# Patient Record
Sex: Female | Born: 1975 | Hispanic: No | Smoking: Never smoker
Health system: Southern US, Community
[De-identification: ages and names within clinical notes are randomized; demographics above are authoritative.]

## PROBLEM LIST (undated history)

## (undated) DIAGNOSIS — B1910 Unspecified viral hepatitis B without hepatic coma: Secondary | ICD-10-CM

## (undated) HISTORY — PX: ABDOMINAL HYSTERECTOMY: SHX81

---

## 2018-03-03 ENCOUNTER — Other Ambulatory Visit: Payer: Self-pay | Admitting: Obstetrics and Gynecology

## 2018-03-03 DIAGNOSIS — R928 Other abnormal and inconclusive findings on diagnostic imaging of breast: Secondary | ICD-10-CM

## 2018-03-08 ENCOUNTER — Ambulatory Visit
Admission: RE | Admit: 2018-03-08 | Discharge: 2018-03-08 | Disposition: A | Discharge status: Home / Self Care | Payer: No Typology Code available for payment source | Source: Ambulatory Visit | Attending: Obstetrics and Gynecology | Admitting: Obstetrics and Gynecology

## 2018-03-08 ENCOUNTER — Other Ambulatory Visit: Payer: Self-pay | Admitting: Obstetrics and Gynecology

## 2018-03-08 DIAGNOSIS — R928 Other abnormal and inconclusive findings on diagnostic imaging of breast: Secondary | ICD-10-CM

## 2018-03-08 DIAGNOSIS — N6489 Other specified disorders of breast: Secondary | ICD-10-CM

## 2018-03-14 ENCOUNTER — Emergency Department (HOSPITAL_COMMUNITY)
Admission: EM | Admit: 2018-03-14 | Discharge: 2018-03-14 | Disposition: A | Discharge status: Home / Self Care | Payer: No Typology Code available for payment source | Attending: Emergency Medicine | Admitting: Emergency Medicine

## 2018-03-14 ENCOUNTER — Encounter (HOSPITAL_COMMUNITY): Payer: Self-pay

## 2018-03-14 ENCOUNTER — Other Ambulatory Visit: Payer: Self-pay

## 2018-03-14 DIAGNOSIS — R6883 Chills (without fever): Secondary | ICD-10-CM | POA: Diagnosis present

## 2018-03-14 DIAGNOSIS — N390 Urinary tract infection, site not specified: Secondary | ICD-10-CM

## 2018-03-14 HISTORY — DX: Unspecified viral hepatitis B without hepatic coma: B19.10

## 2018-03-14 LAB — URINALYSIS, ROUTINE W REFLEX MICROSCOPIC
Bilirubin Urine: NEGATIVE
Glucose, UA: NEGATIVE mg/dL
Hgb urine dipstick: NEGATIVE
Ketones, ur: 5 mg/dL — AB
Nitrite: NEGATIVE
Protein, ur: NEGATIVE mg/dL
SPECIFIC GRAVITY, URINE: 1.009 (ref 1.005–1.030)
pH: 6 (ref 5.0–8.0)

## 2018-03-14 MED ORDER — CIPROFLOXACIN HCL 500 MG PO TABS: 500.0000 mg | ORAL_TABLET | Freq: Two times a day (BID) | ORAL | 0 refills | Status: AC

## 2018-03-14 MED ORDER — CIPROFLOXACIN HCL 250 MG PO TABS
500.0000 mg | ORAL_TABLET | Freq: Once | ORAL | Status: DC
  Filled 2018-03-14: qty 2

## 2018-03-14 NOTE — ED Notes (Signed)
RN attempted to educate Pt on UTI and treatment for infection. Pt acknowledged understanding and willingly refused to take her Antibiotics. Pt stated she does not want to get her antibiotics or use her prescription. Pt states she would rather try treating it on her own. MD Notified.

## 2018-03-14 NOTE — ED Provider Notes (Signed)
[facility] EMERGENCY DEPARTMENT Provider Note   CSN: [id number] Arrival date & time: 03/14/18  0006  Time seen 12:40 AM   History   Chief Complaint Chief Complaint  Patient presents with  . Chills    HPI Nicole Mcpherson is a 42 y.o. female.  HPI patient reports about 2 days ago she started getting chills that lasted 5 to 10 minutes.  She states she has discomfort in her arms and her legs and her muscles feel tight.  She states when she checks her temperature however it is only 98.  She denies nausea, vomiting, diarrhea, dysuria, hematuria, or flank pain.  She does describe some lower back pain and has had frequency about 3-5 times a day for the past 3 days and nocturia about twice a night for the last 3 days.  She also has some right lower quadrant pain which actually she has had for many months.  She states her mouth feels dry but she is drinking normally.  She states sometimes she feels like she has something stuck in her throat but it goes away if she drinks something in a few minutes.  She states she was having heavy periods and had a hysterectomy last year.  She had to have transfusions done and got 3 units of blood.  She states she was in [place] in [place] and was tested for STDs including hepatitis screen due to her complaints of right lower quadrant pain and her hepatitis screen was negative.  She states she was seen again last week by her GYN Dr. Skeet Latch had a positive hepatitis B titer.  She also had trichomonas that she states was treated with Keflex.  She has an appointment to be rechecked on the 28th to get a ultrasound of her ovaries.  When I talked to the patient she is actually been having this pain for many months.  She states the reason she came to the ED is because of the chills.  Patient also states she has had pain in her right ear for a week and it actually is the whole right side of her face that is been there constantly.  Patient states she was seen in urgent  care 3 weeks ago for the right sided facial pain although she told me it only started a week ago and had a negative strep at that time.  PCP Ranae Pila, MD   Past Medical History:  Diagnosis Date  . Hepatitis B     There are no active problems to display for this patient.   Past Surgical History:  Procedure Laterality Date  . ABDOMINAL HYSTERECTOMY       OB History   None      Home Medications    Prior to Admission medications   Medication Sig Start Date End Date Taking? Authorizing Provider  ciprofloxacin (CIPRO) 500 MG tablet Take 1 tablet (500 mg total) by mouth 2 (two) times daily. 03/14/18   Devoria Albe, MD    Family History Family History  Problem Relation Age of Onset  . Breast cancer Sister 56    Social History Social History   Tobacco Use  . Smoking status: Never Smoker  . Smokeless tobacco: Never Used  Substance Use Topics  . Alcohol use: Never    Frequency: Never  . Drug use: Never  employed, lives in [place] Works in quality control for land[employer] States sexually active with same partner since left her husband five years  ago   Allergies   Patient has no known allergies.   Review of Systems Review of Systems  All other systems reviewed and are negative.    Physical Exam Updated Vital Signs BP (!) 111/57   Pulse (!) 49   Temp 97.9 F (36.6 C) (Oral)   Resp 16   Ht 5\' 2"  (1.575 m)   Wt 47.6 kg   SpO2 100%   BMI 19.20 kg/m   Vital signs normal except for bradycardia   Physical Exam  Constitutional: She is oriented to person, place, and time. She appears well-developed and well-nourished.  Non-toxic appearance. She does not appear ill. No distress.  HENT:  Head: Normocephalic and atraumatic.  Right Ear: Tympanic membrane, external ear and ear canal normal. No drainage, swelling or tenderness. No foreign bodies. No middle ear effusion.  Left Ear: External ear normal.  Nose: Nose normal. No mucosal  edema or rhinorrhea.  Mouth/Throat: Oropharynx is clear and moist and mucous membranes are normal. No dental abscesses or uvula swelling.  Eyes: Pupils are equal, round, and reactive to light. Conjunctivae and EOM are normal.  Neck: Normal range of motion and full passive range of motion without pain. Neck supple.  Cardiovascular: Normal rate, regular rhythm and normal heart sounds. Exam reveals no gallop and no friction rub.  No murmur heard. Pulmonary/Chest: Effort normal and breath sounds normal. No respiratory distress. She has no wheezes. She has no rhonchi. She has no rales. She exhibits no tenderness and no crepitus.  Abdominal: Soft. Normal appearance and bowel sounds are normal. She exhibits no distension. There is tenderness. There is no rebound and no guarding.    Musculoskeletal: Normal range of motion. She exhibits no edema or tenderness.  Moves all extremities well.   Neurological: She is alert and oriented to person, place, and time. She has normal strength. No cranial nerve deficit.  Skin: Skin is warm, dry and intact. No rash noted. No erythema. No pallor.  Psychiatric: She has a normal mood and affect. Her speech is normal and behavior is normal. Her mood appears not anxious.  Nursing note and vitals reviewed.    ED Treatments / Results  Labs (all labs ordered are listed, but only abnormal results are displayed) Results for orders placed or performed during the hospital encounter of 03/14/18  Urinalysis, Routine w reflex microscopic  Result Value Ref Range   Color, Urine YELLOW YELLOW   APPearance CLEAR CLEAR   Specific Gravity, Urine 1.009 1.005 - 1.030   pH 6.0 5.0 - 8.0   Glucose, UA NEGATIVE NEGATIVE mg/dL   Hgb urine dipstick NEGATIVE NEGATIVE   Bilirubin Urine NEGATIVE NEGATIVE   Ketones, ur 5 (A) NEGATIVE mg/dL   Protein, ur NEGATIVE NEGATIVE mg/dL   Nitrite NEGATIVE NEGATIVE   Leukocytes, UA MODERATE (A) NEGATIVE   RBC / HPF 0-5 0 - 5 RBC/hpf   WBC, UA  21-50 0 - 5 WBC/hpf   Bacteria, UA RARE (A) NONE SEEN   Squamous Epithelial / LPF 0-5 0 - 5   Laboratory interpretation all normal except possible UTI    EKG None  Radiology No results found.  Procedures Procedures (including critical care time)  Medications Ordered in ED Medications - No data to display   Initial Impression / Assessment and Plan / ED Course  I have reviewed the triage vital signs and the nursing notes.  Pertinent labs & imaging results that were available during my care of the patient were reviewed by me  and considered in my medical decision making (see chart for details).   Please note we have been having 90+ degree weather all summer and today the high was 68 and rainy all day.   Patient has minimal records in our epic system.  She has had some prior mammograms.  There are no records of her visit with her GYN this week.  Patient remained afebrile in the ED.  Patient states she was treated with Keflex recently, she was placed on Cipro for her UTI.  Urine culture was sent.  Patient read should return to the emergency department she gets uncontrolled vomiting.  She was advised to take Motrin for body aches or fever.  She was not recommended to use acetaminophen due to her recent diagnosis of hepatitis B.  Final Clinical Impressions(s) / ED Diagnoses   Final diagnoses:  Chills  Urinary tract infection without hematuria, site unspecified    ED Discharge Orders         Ordered    ciprofloxacin (CIPRO) 500 MG tablet  2 times daily     03/14/18 0144        OTC ibuprofen    Plan discharge  Devoria AlbeIva Porscha Axley, MD, [facility]oFACEP    Cung Masterson, MD 03/14/18 [id number]

## 2018-03-14 NOTE — ED Triage Notes (Addendum)
Pt reports chills, body aches, feeling cold, and right ear numbness x 2 days. Pt denies fever. Pt also reports lower abd pain on the right side. Pt was diagnosed with UTI and prescribed Keflex about two weeks ago. Pt was seen in [place] at an emergency dept. Pt says this feels similar.

## 2018-03-14 NOTE — Discharge Instructions (Signed)
Take the antibiotic until gone. You can take ibuprofen 400 mg every 6 hrs as needed for pain or fevers. Keep your appointment with Dr Elon SpannerLeger on the 28th. Return to the ED if you get uncontrolled vomiting.

## 2018-03-15 LAB — URINE CULTURE: Special Requests: NORMAL

## 2018-09-09 ENCOUNTER — Other Ambulatory Visit: Payer: Self-pay | Admitting: Obstetrics and Gynecology

## 2018-09-09 DIAGNOSIS — N6489 Other specified disorders of breast: Secondary | ICD-10-CM

## 2018-09-13 ENCOUNTER — Other Ambulatory Visit: Payer: No Typology Code available for payment source

## 2019-01-03 IMAGING — US ULTRASOUND LEFT BREAST LIMITED
1 series · 2 of 2 positions shown · non-contrast
Comparison: Previous exam(s).

CLINICAL DATA: 42-year-old female recalled from baseline screening
mammogram dated 03/02/2018 for bilateral asymmetries.

EXAM:
DIGITAL DIAGNOSTIC BILATERAL MAMMOGRAM WITH CAD AND TOMO
ULTRASOUND BILATERAL BREAST

[Series 1: ultrasound left breast limited · 0.06mm/px · 2 of 2 slices shown]
[im 1/2]
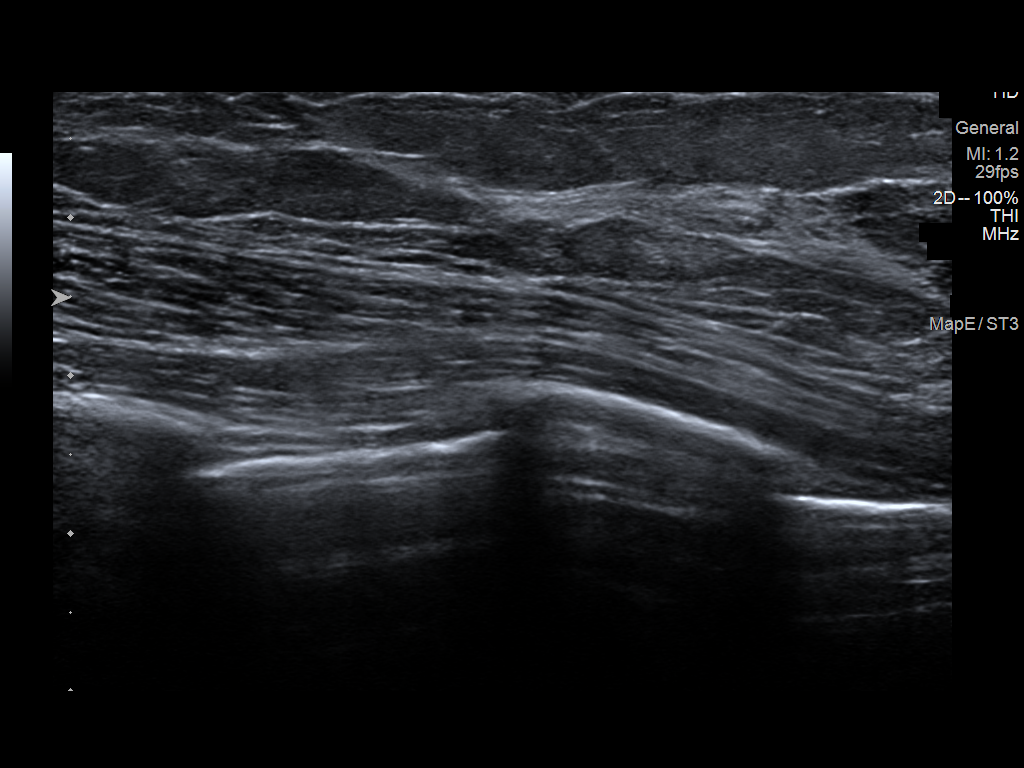
[im 2/2]
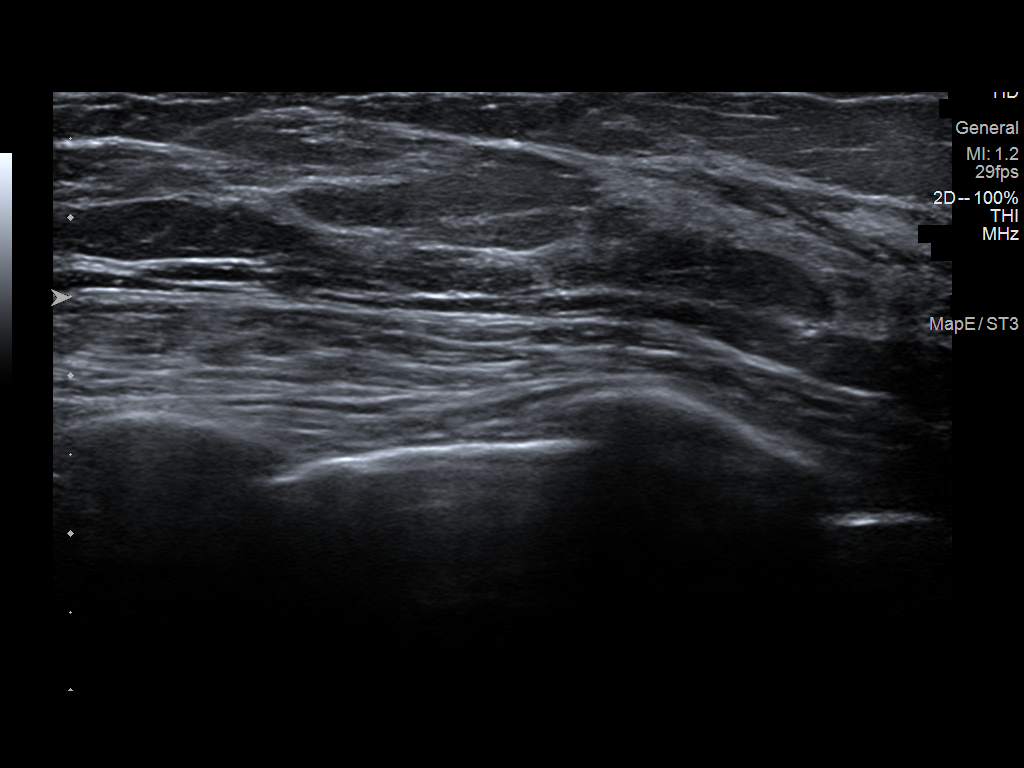

[2 of 2 positions shown; findings below may reference images not displayed]

ACR Breast Density Category d: The breast tissue is extremely dense,
which lowers the sensitivity of mammography.
FINDINGS: Previously described asymmetries persists in the superior central
right breast at posterior depth and far posterosuperior left breast
on the MLO projection only. Both areas demonstrate interspersed fat
suggesting a benign etiology. Further evaluation with ultrasound was
performed.

Mammographic images were processed with CAD.

Targeted ultrasound is performed, showing areas of dense
fibroglandular tissue in the right breast at the 1 o'clock position
8 cm from the nipple and far upper outer left breast. No focal or
suspicious sonographic findings are identified.
IMPRESSION: Probably benign asymmetries bilaterally without suspicious
ultrasound correlate. Findings may represent focal islands of
fibroglandular tissue is seen on the patient's baseline study.
Precautionary short-term follow-up is recommended.

RECOMMENDATION:
Bilateral diagnostic mammogram and possible ultrasound in 6 months.

I have discussed the findings and recommendations with the patient.
Results were also provided in writing at the conclusion of the
visit. If applicable, a reminder letter will be sent to the patient
regarding the next appointment.

BI-RADS CATEGORY  3: Probably benign.

## 2019-01-03 IMAGING — US ULTRASOUND RIGHT BREAST LIMITED
1 series · 2 of 2 positions shown · non-contrast
Comparison: Previous exam(s).

CLINICAL DATA: 42-year-old female recalled from baseline screening
mammogram dated 03/02/2018 for bilateral asymmetries.

EXAM:
DIGITAL DIAGNOSTIC BILATERAL MAMMOGRAM WITH CAD AND TOMO
ULTRASOUND BILATERAL BREAST

[Series 1: ultrasound right breast limited · 0.06mm/px · 2 of 2 slices shown]
[im 1/2]
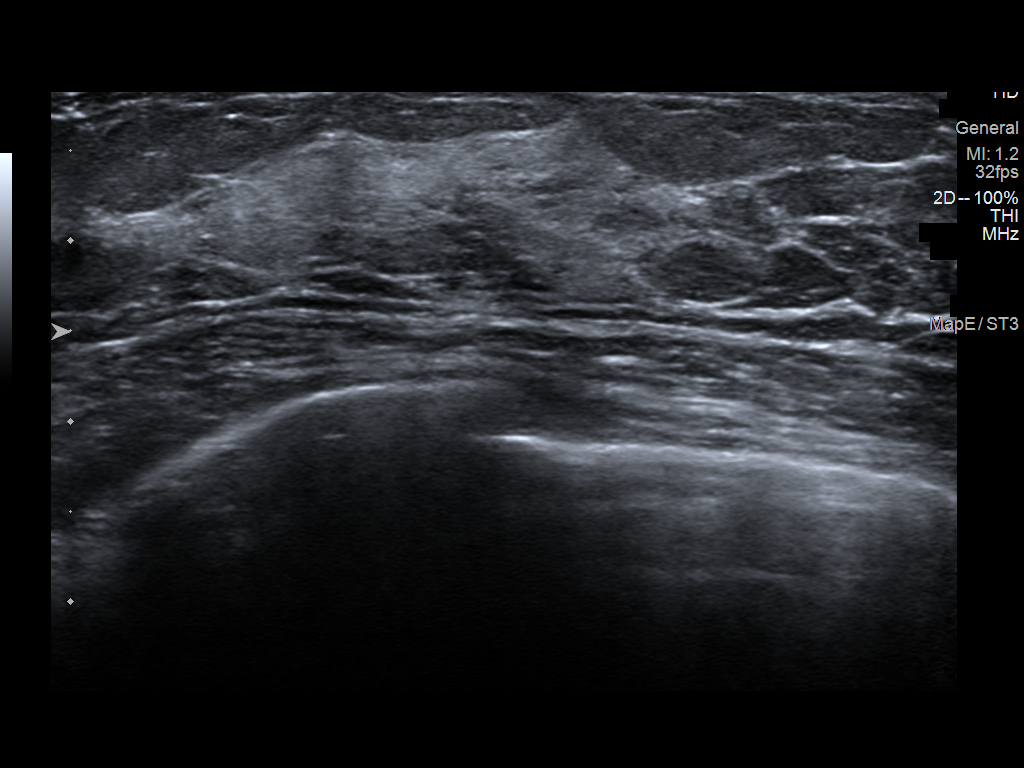
[im 2/2]
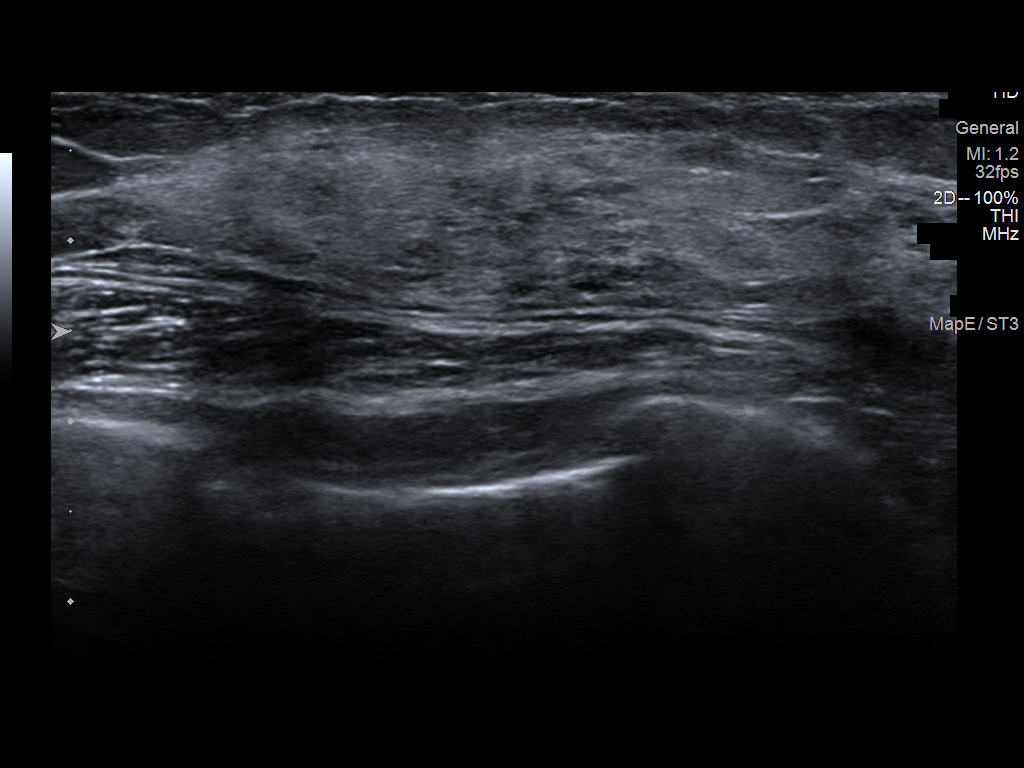

[2 of 2 positions shown; findings below may reference images not displayed]

ACR Breast Density Category d: The breast tissue is extremely dense,
which lowers the sensitivity of mammography.
FINDINGS: Previously described asymmetries persists in the superior central
right breast at posterior depth and far posterosuperior left breast
on the MLO projection only. Both areas demonstrate interspersed fat
suggesting a benign etiology. Further evaluation with ultrasound was
performed.

Mammographic images were processed with CAD.

Targeted ultrasound is performed, showing areas of dense
fibroglandular tissue in the right breast at the 1 o'clock position
8 cm from the nipple and far upper outer left breast. No focal or
suspicious sonographic findings are identified.
IMPRESSION: Probably benign asymmetries bilaterally without suspicious
ultrasound correlate. Findings may represent focal islands of
fibroglandular tissue is seen on the patient's baseline study.
Precautionary short-term follow-up is recommended.

RECOMMENDATION:
Bilateral diagnostic mammogram and possible ultrasound in 6 months.

I have discussed the findings and recommendations with the patient.
Results were also provided in writing at the conclusion of the
visit. If applicable, a reminder letter will be sent to the patient
regarding the next appointment.

BI-RADS CATEGORY  3: Probably benign.

## 2019-01-03 IMAGING — MG DIGITAL DIAGNOSTIC BILATERAL MAMMOGRAM WITH TOMO AND CAD
6 of 10 series · 6 of 30 positions shown · non-contrast
Comparison: Previous exam(s).

CLINICAL DATA: 42-year-old female recalled from baseline screening
mammogram dated 03/02/2018 for bilateral asymmetries.

EXAM:
DIGITAL DIAGNOSTIC BILATERAL MAMMOGRAM WITH CAD AND TOMO
ULTRASOUND BILATERAL BREAST

[R MLO synth-2D]
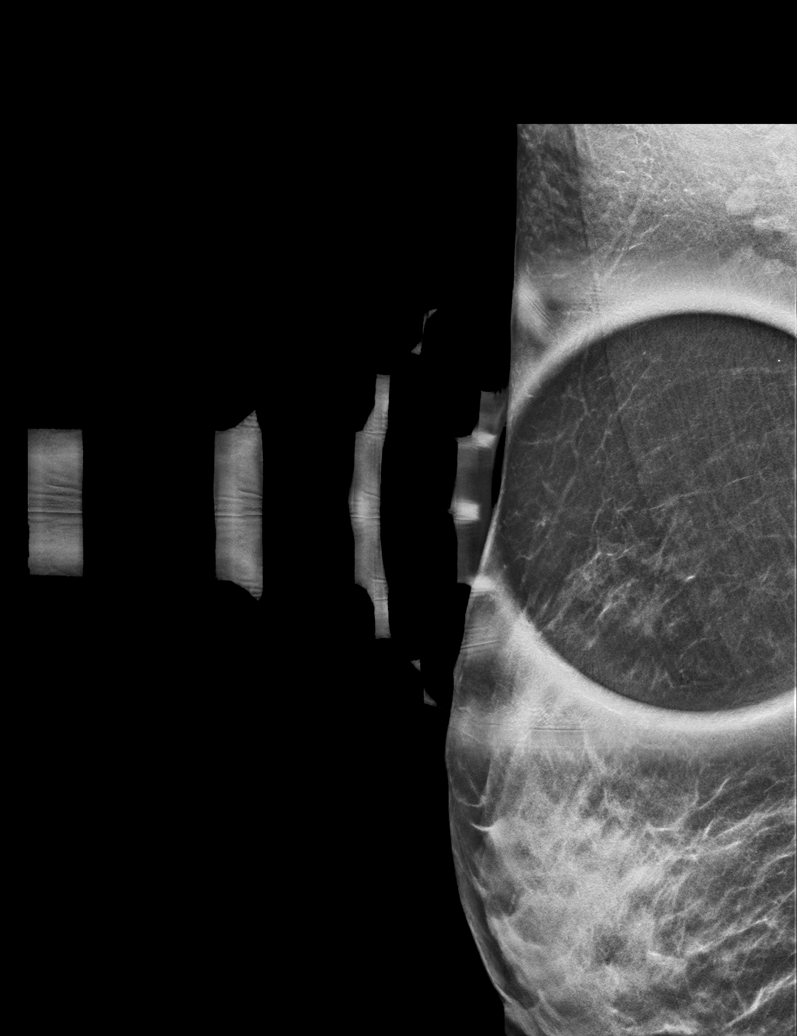

[R CC synth-2D (1 of 2)]
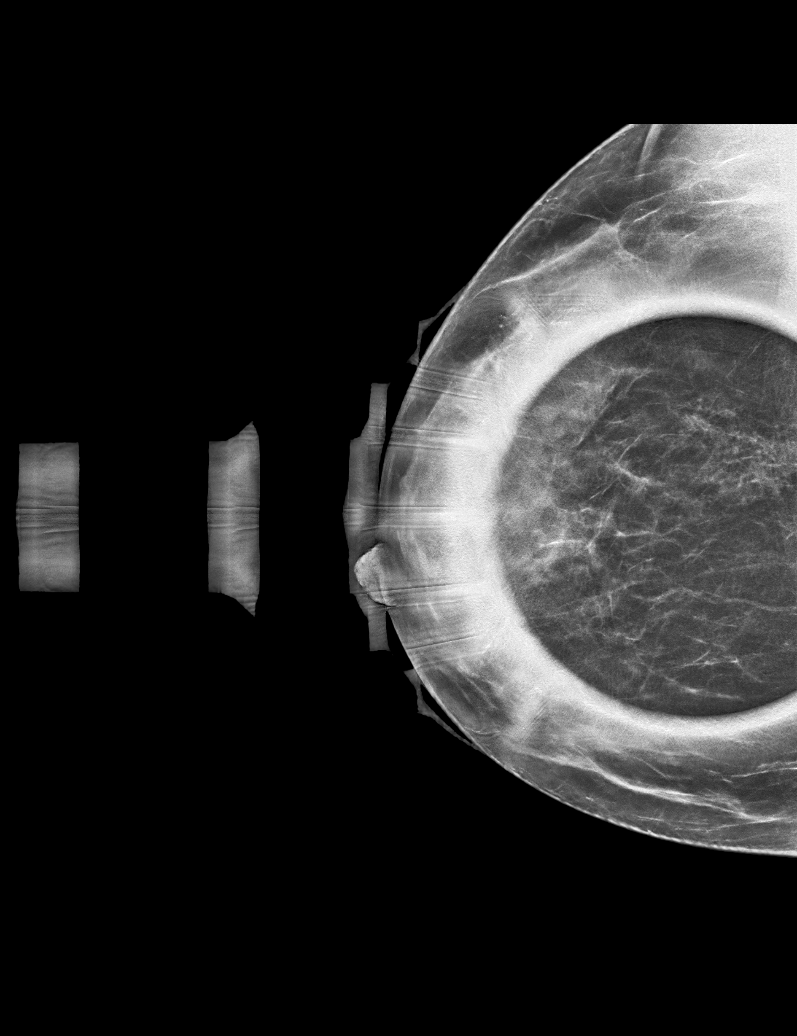

[L MLO synth-2D]
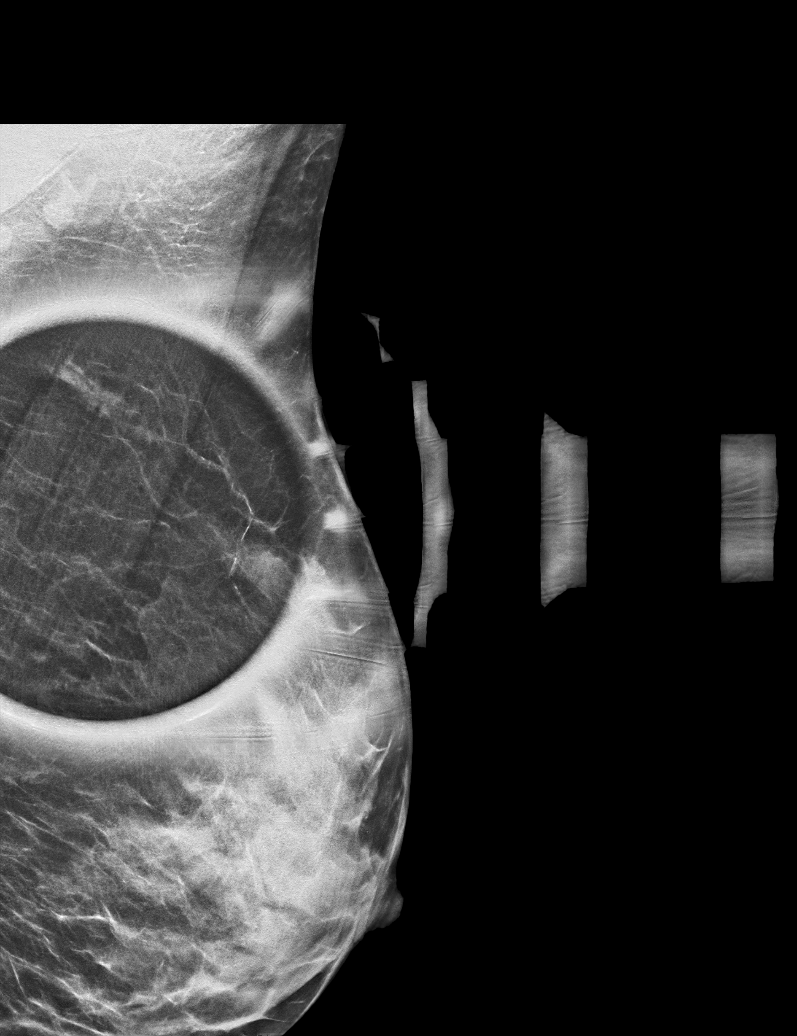

[L ML synth-2D]
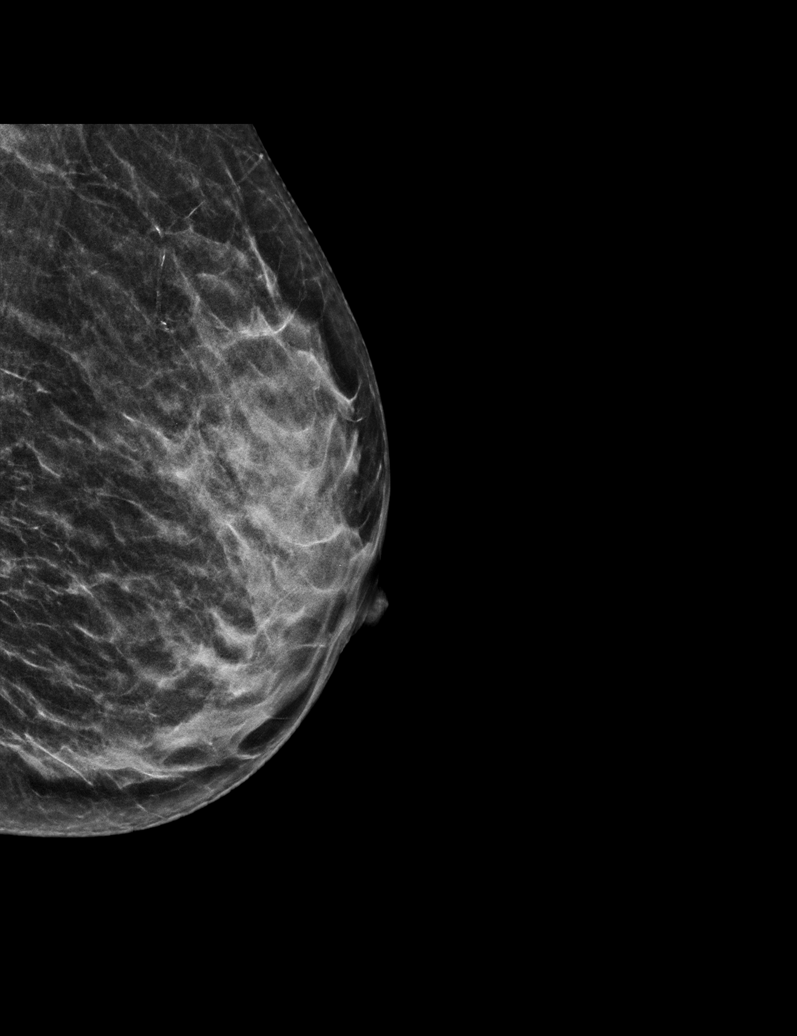

[R CC synth-2D (2 of 2)]
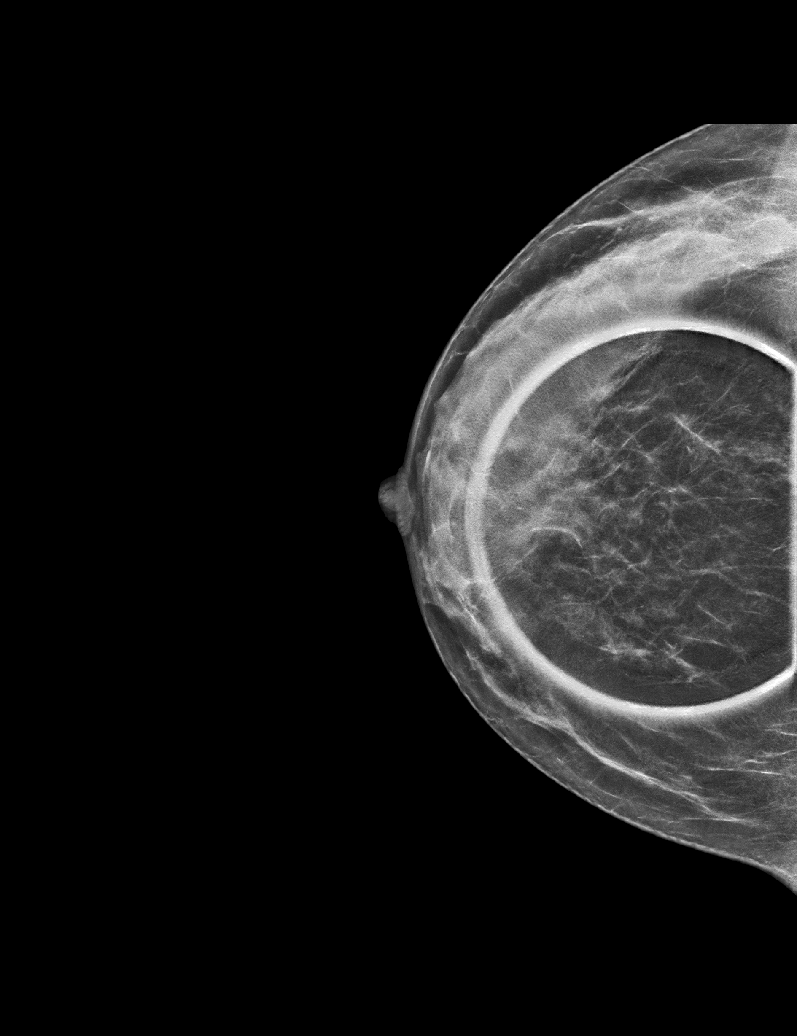

[R CC tomo · tomo slice 25/49.0]
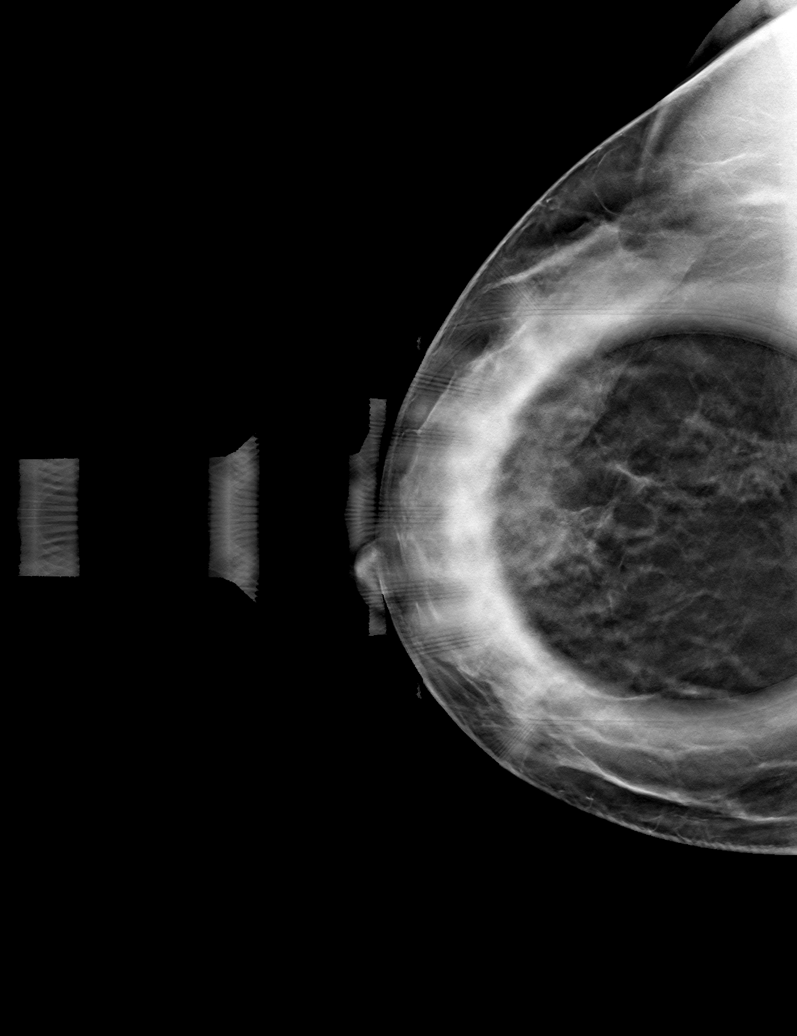

[6 of 30 positions shown; findings below may reference images not displayed]

ACR Breast Density Category d: The breast tissue is extremely dense,
which lowers the sensitivity of mammography.
FINDINGS: Previously described asymmetries persists in the superior central
right breast at posterior depth and far posterosuperior left breast
on the MLO projection only. Both areas demonstrate interspersed fat
suggesting a benign etiology. Further evaluation with ultrasound was
performed.

Mammographic images were processed with CAD.

Targeted ultrasound is performed, showing areas of dense
fibroglandular tissue in the right breast at the 1 o'clock position
8 cm from the nipple and far upper outer left breast. No focal or
suspicious sonographic findings are identified.
IMPRESSION: Probably benign asymmetries bilaterally without suspicious
ultrasound correlate. Findings may represent focal islands of
fibroglandular tissue is seen on the patient's baseline study.
Precautionary short-term follow-up is recommended.

RECOMMENDATION:
Bilateral diagnostic mammogram and possible ultrasound in 6 months.

I have discussed the findings and recommendations with the patient.
Results were also provided in writing at the conclusion of the
visit. If applicable, a reminder letter will be sent to the patient
regarding the next appointment.

BI-RADS CATEGORY  3: Probably benign.
